# Patient Record
Sex: Male | Born: 2000 | Hispanic: Yes | Marital: Single | Smoking: Never smoker
Health system: Southern US, Community
[De-identification: ages and names within clinical notes are randomized; demographics above are authoritative.]

## PROBLEM LIST (undated history)

## (undated) DIAGNOSIS — N3944 Nocturnal enuresis: Secondary | ICD-10-CM

## (undated) DIAGNOSIS — F909 Attention-deficit hyperactivity disorder, unspecified type: Secondary | ICD-10-CM

---
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
## 2021-11-25 ENCOUNTER — Ambulatory Visit
Admission: EM | Admit: 2021-11-25 | Discharge: 2021-11-25 | Disposition: A | Discharge status: Home / Self Care | Payer: Medicaid Other | Attending: Urgent Care | Admitting: Urgent Care

## 2021-11-25 DIAGNOSIS — H6502 Acute serous otitis media, left ear: Secondary | ICD-10-CM | POA: Diagnosis not present

## 2021-11-25 DIAGNOSIS — J029 Acute pharyngitis, unspecified: Secondary | ICD-10-CM

## 2021-11-25 MED ORDER — AMOXICILLIN 500 MG PO CAPS: 1000.0000 mg | ORAL_CAPSULE | Freq: Two times a day (BID) | ORAL | 0 refills | Status: AC

## 2021-11-25 NOTE — ED Provider Notes (Signed)
[facility]    CSN: [id number] Arrival date & time: 11/25/21  1526      History   Chief Complaint No chief complaint on file.   HPI Brady Perez is a 21 y.o. male.   Pleasant 21yo male presents today with a one day onset of L sided headache, ear pain, throat pain, swollen lymph nodes, and sensitive skin. States yesterday was worse than today, had a fever but exact temp unknown. Took OTC ibuprofen yesterday and again today which resolved his headache. He states the pain sx that persist today is sore throat and ear pain on L. Endorses odynophagia, but not dysphagia. Denies cough, sob, n/v/d, fatigue, rash.      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 Authorizing Provider  amoxicillin (AMOXIL) 500 MG capsule Take 2 capsules (1,000 mg total) by mouth 2 (two) times daily for 10 days. 11/25/21 12/05/21 Yes Lorita Forinash L, PA    Family History Family History  Problem Relation Age of Onset   Heart disease Paternal Grandfather    Hypertension Paternal Grandfather    Heart attack Paternal Grandfather    Healthy Mother    Diabetes Father     Social History Social History   Tobacco Use   Smoking status: Never   Smokeless tobacco: Never  Vaping Use   Vaping Use: Former  Substance Use Topics   Alcohol use: Yes   Drug use: Never      Allergies   Contrast media [iodinated contrast media]   Review of Systems Review of Systems As per HPI  Physical Exam Triage Vital Signs ED Triage Vitals  Enc Vitals Group     BP 11/25/21 1630 114/68     Pulse Rate 11/25/21 1630 72     Resp --      Temp 11/25/21 1630 98.5 F (36.9 C)     Temp Source 11/25/21 1630 Oral     SpO2 11/25/21 1630 99 %     Weight 11/25/21 1628 120 lb (54.4 kg)     Height 11/25/21 1628 5' 2.5" (1.588 m)     Head Circumference --      Peak Flow --      Pain Score 11/25/21 1628 7     Pain Loc --      Pain Edu? --      Excl. in GC? --    No data found.  Updated Vital Signs BP 114/68 (BP Location: Left Arm)   Pulse 72   Temp 98.5 F (36.9 C) (Oral)   Ht 5' 2.5" (1.588 m)   Wt 120 lb (54.4 kg)   SpO2 99%   BMI 21.60 kg/m   Visual Acuity Right Eye Distance:   Left Eye Distance:   Bilateral Distance:    Right Eye Near:   Left Eye  Near:    Bilateral Near:     Physical Exam Vitals and nursing note reviewed.  Constitutional:      General: He is not in acute distress.    Appearance: Normal appearance. He is normal weight. He is not ill-appearing, toxic-appearing or diaphoretic.  HENT:     Head: Normocephalic and atraumatic.     Right Ear: Tympanic membrane, ear canal and external ear normal. No drainage, swelling or tenderness. No middle ear effusion. There is no impacted cerumen. Tympanic membrane is not injected, scarred, erythematous or bulging.     Left Ear: Ear canal and external ear normal. No drainage, swelling or tenderness.  No middle ear effusion. There is no impacted cerumen. Tympanic membrane is injected, erythematous and bulging. Tympanic membrane is not scarred.     Nose: Nose normal.     Right Turbinates: Not enlarged or swollen.     Left Turbinates: Not enlarged or swollen.     Right Sinus: No maxillary sinus tenderness or frontal sinus tenderness.     Left Sinus: No maxillary sinus tenderness or frontal sinus  tenderness.     Mouth/Throat:     Lips: Pink.     Mouth: No oral lesions.     Palate: No lesions.     Pharynx: Oropharynx is clear. Uvula midline. Posterior oropharyngeal erythema present. No pharyngeal swelling or uvula swelling.     Tonsils: No tonsillar exudate or tonsillar abscesses.     Comments: Fetid breath Petechiae to soft palate midline Eyes:     General: No scleral icterus.       Right eye: No discharge.        Left eye: No discharge.     Extraocular Movements: Extraocular movements intact.     Conjunctiva/sclera: Conjunctivae normal.     Pupils: Pupils are equal, round, and reactive to light.  Cardiovascular:     Rate and Rhythm: Normal rate and regular rhythm.     Pulses: Normal pulses.     Heart sounds: Normal heart sounds. No murmur heard. Pulmonary:     Effort: Pulmonary effort is normal. No respiratory distress.     Breath sounds: Normal breath sounds. No stridor. No wheezing or rhonchi.  Musculoskeletal:     Cervical back: Normal range of motion and neck supple. No rigidity or tenderness.  Lymphadenopathy:     Cervical: Cervical adenopathy (B submandibular) present.  Skin:    General: Skin is warm.     Findings: No erythema or rash.  Neurological:     General: No focal deficit present.     Mental Status: He is alert and oriented to person, place, and time.      UC Treatments / Results  Labs (all labs ordered are listed, but only abnormal results are displayed) Labs Reviewed - No data to display  EKG   Radiology No results found.  Procedures Procedures (including critical care time)  Medications Ordered in UC Medications - No data to display  Initial Impression / Assessment and Plan / UC Course  I have reviewed the triage vital signs and the nursing notes.  Pertinent labs & imaging results that were available during my care of the patient were reviewed by me and considered in my medical decision making (see chart for details).     L OM - L  sided ear pain/ headache likely secondary to visible OM. Will start amoxicillin BID x 10 days Acute pharyngitis - discussed obtaining rapid strep, however deferred due to patient being  sent home on abx for L OM regardless of strep result. Unlikely to be mono given clinical presentation. Avoid sharing food with others and avoid kissing on lips for minimum of 24 hours, change out toothbrush after 3 days. RTC precautions discussed   Final Clinical Impressions(s) / UC Diagnoses   Final diagnoses:  Non-recurrent acute serous otitis media of left ear  Acute pharyngitis, unspecified etiology     Discharge Instructions      You have a middle ear infection on the left. Ultimately we did not swab your throat for strep because the medication used for your ear infection will also cover for your throat. Do not share food or drink for 2 days. Take all antibiotics until completed Throw away your toothbrush after the third day of antibiotics. RTC if any continued symptoms, fever, or rash     ED Prescriptions     Medication Sig Dispense Auth. Provider   amoxicillin (AMOXIL) 500 MG capsule Take 2 capsules (1,000 mg total) by mouth 2 (two) times daily for 10 days. 40 capsule Marques Ericson L, PA      PDMP not reviewed this encounter.   Maretta Bees, [place] 11/25/21 1934

## 2021-11-25 NOTE — ED Triage Notes (Signed)
Patient c/o left sided headache , left ear pain, neck pain on the left side. -- started yesterday.   Patient has had diarrhea.   Patient did a nasal cleanse and it did relieve some pressure in his head.

## 2021-11-25 NOTE — Discharge Instructions (Signed)
You have a middle ear infection on the left. Ultimately we did not swab your throat for strep because the medication used for your ear infection will also cover for your throat. Do not share food or drink for 2 days. Take all antibiotics until completed Throw away your toothbrush after the third day of antibiotics. RTC if any continued symptoms, fever, or rash

## 2023-05-23 ENCOUNTER — Ambulatory Visit
Admission: EM | Admit: 2023-05-23 | Discharge: 2023-05-23 | Disposition: A | Discharge status: Home / Self Care | Payer: Federal, State, Local not specified - PPO | Attending: Family Medicine | Admitting: Family Medicine

## 2023-05-23 DIAGNOSIS — J101 Influenza due to other identified influenza virus with other respiratory manifestations: Secondary | ICD-10-CM | POA: Diagnosis not present

## 2023-05-23 LAB — RESP PANEL BY RT-PCR (FLU A&B, COVID) ARPGX2
Influenza A by PCR: POSITIVE — AB
Influenza B by PCR: NEGATIVE
SARS Coronavirus 2 by RT PCR: NEGATIVE

## 2023-05-23 LAB — GROUP A STREP BY PCR: Group A Strep by PCR: NOT DETECTED

## 2023-05-23 MED ORDER — OSELTAMIVIR PHOSPHATE 75 MG PO CAPS: 75.0000 mg | ORAL_CAPSULE | Freq: Two times a day (BID) | ORAL | 0 refills | Status: AC

## 2023-05-23 NOTE — Discharge Instructions (Addendum)
 You have influena A.  Your COVID and strep tests are negative. Stop by the pharmacy to pick up your prescriptions.    You can take Tylenol  and/or Ibuprofen  as needed for fever reduction and pain relief.    For cough: honey 1/2 to 1 teaspoon (you can dilute the honey in water or another fluid).  You can also use guaifenesin and dextromethorphan for cough. You can use a humidifier for chest congestion and cough.  If you don't have a humidifier, you can sit in the bathroom with the hot shower running.      For sore throat: try warm salt water gargles, Mucinex sore throat cough drops or cepacol lozenges, throat spray, warm tea or water with lemon/honey, popsicles or ice, or OTC cold relief medicine for throat discomfort. You can also purchase chloraseptic spray at the pharmacy or dollar store.   For congestion: take a daily anti-histamine like Zyrtec, Claritin, and a oral decongestant, such as pseudoephedrine.  You can also use Flonase 1-2 sprays in each nostril daily. Afrin is also a good option, if you do not have high blood pressure.    It is important to stay hydrated: drink plenty of fluids (water, gatorade/powerade/pedialyte, juices, or teas) to keep your throat moisturized and help further relieve irritation/discomfort.    Return or go to the Emergency Department if symptoms worsen or do not improve in the next few days

## 2023-05-23 NOTE — ED Provider Notes (Addendum)
 [facility]    CSN: [id number] Arrival date & time: 05/23/23  1121      History   Chief Complaint Chief Complaint  Patient presents with   Cough   Fever   Sore Throat    HPI Brady Perez is a 23 y.o. male.   HPI  History obtained from the patient. Brady Perez presents for dry cough, fever, sore throat that started yesterday. He and his cousin went snowboarding. Body aches started on Monday.  Has been more tired. He is hoarse and pain with swallowing. Has swelling on the right side of his neck.  Took Tylenol  and ibuprofen . Tmax 103 F.  No vomiting or diarrhea.   No asthma. He vapes with nicotine.     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 Authorizing Provider  oseltamivir  (TAMIFLU ) 75 MG capsule Take 1 capsule (75 mg total) by mouth every 12 (twelve) hours. 05/23/23  Yes Kriste Berth, DO    Family History Family History  Problem Relation Age of Onset   Heart disease Paternal Grandfather    Hypertension Paternal Grandfather    Heart attack Paternal Grandfather    Healthy Mother    Diabetes Father     Social History Social History   Tobacco Use   Smoking status: Never   Smokeless tobacco: Never  Vaping Use   Vaping status: Former  Substance Use Topics   Alcohol use: Yes   Drug use: Never     Allergies    Contrast media [iodinated contrast media]   Review of Systems Review of Systems: negative unless otherwise stated in HPI.      Physical Exam Triage Vital Signs ED Triage Vitals  Encounter Vitals Group     BP 05/23/23 1219 113/64     Systolic BP Percentile --      Diastolic BP Percentile --      Pulse Rate 05/23/23 1219 90     Resp 05/23/23 1219 19     Temp 05/23/23 1219 98.8 F (37.1 C)     Temp Source 05/23/23 1219 Oral     SpO2 05/23/23 1219 99 %     Weight --      Height --      Head Circumference --      Peak Flow --      Pain Score 05/23/23 1218 0     Pain Loc --      Pain Education --      Exclude from Growth Chart --    No data found.  Updated Vital Signs BP 113/64 (BP Location: Left Arm)   Pulse 90   Temp 98.8 F (37.1 C) (Oral)   Resp 19   SpO2 99%   Visual Acuity Right Eye Distance:   Left Eye Distance:   Bilateral Distance:  Right Eye Near:   Left Eye Near:    Bilateral Near:     Physical Exam GEN:     alert, non-toxic appearing male in no distress    HENT:  mucus membranes moist, oropharyngeal without lesions or erythema, no tonsillar hypertrophy or exudates, clear nasal discharge, bilateral TM normal EYES:   pupils equal and reactive, no scleral injection or discharge NECK:  normal ROM, no lymphadenopathy, no meningismus   RESP:  no increased work of breathing, clear to auscultation bilaterally CVS:   regular rate and rhythm ABD:   Soft, nontender, nondistended Skin:   warm and dry    UC Treatments / Results  Labs (all labs ordered are listed, but only abnormal results are displayed) Labs Reviewed  RESP PANEL BY RT-PCR (FLU A&B, COVID) ARPGX2 - Abnormal; Notable for the following components:      Result Value   Influenza A by PCR POSITIVE (*)    All other components within normal limits  GROUP A STREP BY PCR    EKG   Radiology No results found.  Procedures Procedures (including critical care time)  Medications Ordered  in UC Medications - No data to display  Initial Impression / Assessment and Plan / UC Course  I have reviewed the triage vital signs and the nursing notes.  Pertinent labs & imaging results that were available during my care of the patient were reviewed by me and considered in my medical decision making (see chart for details).       Pt is a 23 y.o. male who presents for 1 day of respiratory symptoms. Shamar is afebrile here without recent antipyretics. Satting well on room air. Overall pt is non-toxic appearing, well hydrated, without respiratory distress. Pulmonary exam is unremarkable.  Strep PCR is negative.  COVID and influenza panel obtained and he is influenza A positive.  After shared decision making, he is agreeable with Tamiflu .  Discussed symptomatic treatment.  Typical duration of symptoms discussed.   Return and ED precautions given and voiced understanding. Discussed MDM, treatment plan and plan for follow-up with patient who agrees with plan.     Final Clinical Impressions(s) / UC Diagnoses   Final diagnoses:  Influenza A     Discharge Instructions      You have influena A.  Your COVID and strep tests are negative. Stop by the pharmacy to pick up your prescriptions.    You can take Tylenol  and/or Ibuprofen  as needed for fever reduction and pain relief.    For cough: honey 1/2 to 1 teaspoon (you can dilute the honey in water or another fluid).  You can also use guaifenesin and dextromethorphan for cough. You can use a humidifier for chest congestion and cough.  If you don't have a humidifier, you can sit in the bathroom with the hot shower running.      For sore throat: try warm salt water gargles, Mucinex sore throat cough drops or cepacol lozenges, throat spray, warm tea or water with lemon/honey, popsicles or ice, or OTC cold relief medicine for throat discomfort. You can also purchase chloraseptic spray at the pharmacy or dollar store.   For congestion: take a  daily anti-histamine like Zyrtec, Claritin, and a oral decongestant, such as pseudoephedrine.  You can also use Flonase 1-2 sprays in each nostril daily. Afrin is also a good option, if you do not have high blood pressure.    It is important to stay hydrated: drink plenty of fluids (water, gatorade/powerade/pedialyte, juices,  or teas) to keep your throat moisturized and help further relieve irritation/discomfort.    Return or go to the Emergency Department if symptoms worsen or do not improve in the next few days      ED Prescriptions     Medication Sig Dispense Auth. Provider   oseltamivir  (TAMIFLU ) 75 MG capsule Take 1 capsule (75 mg total) by mouth every 12 (twelve) hours. 10 capsule Javione Gunawan, DO      PDMP not reviewed this encounter.      Kriste Berth, DO 05/26/23 1133

## 2023-05-23 NOTE — ED Triage Notes (Signed)
 Sx started yesterday  Productive cough with dark mucus Hoarse Fatigue Fever Sore throat
